# Patient Record
Sex: Female | Born: 1975 | Race: Black or African American | Hispanic: No | Marital: Single | Smoking: Never smoker
Health system: Southern US, Community
[De-identification: ages and names within clinical notes are randomized; demographics above are authoritative.]

## PROBLEM LIST (undated history)

## (undated) DIAGNOSIS — F909 Attention-deficit hyperactivity disorder, unspecified type: Secondary | ICD-10-CM

---

## 1999-11-02 ENCOUNTER — Other Ambulatory Visit: Admission: RE | Admit: 1999-11-02 | Discharge: 1999-11-02 | Payer: Self-pay | Admitting: Family Medicine

## 2001-09-06 ENCOUNTER — Other Ambulatory Visit: Admission: RE | Admit: 2001-09-06 | Discharge: 2001-09-06 | Payer: Self-pay | Admitting: Ophthalmology

## 2002-12-25 ENCOUNTER — Other Ambulatory Visit: Admission: RE | Admit: 2002-12-25 | Discharge: 2002-12-25 | Payer: Self-pay | Admitting: Family Medicine

## 2004-12-13 ENCOUNTER — Other Ambulatory Visit: Admission: RE | Admit: 2004-12-13 | Discharge: 2004-12-13 | Payer: Self-pay | Admitting: Family Medicine

## 2006-02-06 ENCOUNTER — Other Ambulatory Visit: Admission: RE | Admit: 2006-02-06 | Discharge: 2006-02-06 | Payer: Self-pay | Admitting: Family Medicine

## 2006-06-09 ENCOUNTER — Emergency Department (HOSPITAL_COMMUNITY): Admission: EM | Admit: 2006-06-09 | Discharge: 2006-06-09 | Payer: Self-pay | Admitting: Emergency Medicine

## 2006-06-11 ENCOUNTER — Encounter: Admission: RE | Admit: 2006-06-11 | Discharge: 2006-06-11 | Payer: Self-pay | Admitting: Family Medicine

## 2006-11-11 ENCOUNTER — Emergency Department (HOSPITAL_COMMUNITY): Admission: EM | Admit: 2006-11-11 | Discharge: 2006-11-12 | Payer: Self-pay | Admitting: Emergency Medicine

## 2007-09-11 ENCOUNTER — Other Ambulatory Visit: Admission: RE | Admit: 2007-09-11 | Discharge: 2007-09-11 | Payer: Self-pay | Admitting: Family Medicine

## 2007-12-29 ENCOUNTER — Emergency Department (HOSPITAL_COMMUNITY): Admission: EM | Admit: 2007-12-29 | Discharge: 2007-12-29 | Payer: Self-pay | Admitting: Emergency Medicine

## 2008-04-19 ENCOUNTER — Emergency Department (HOSPITAL_BASED_OUTPATIENT_CLINIC_OR_DEPARTMENT_OTHER): Admission: EM | Admit: 2008-04-19 | Discharge: 2008-04-19 | Payer: Self-pay | Admitting: Emergency Medicine

## 2008-05-20 ENCOUNTER — Encounter: Admission: RE | Admit: 2008-05-20 | Discharge: 2008-05-20 | Payer: Self-pay | Admitting: Family Medicine

## 2008-10-12 ENCOUNTER — Encounter: Admission: RE | Admit: 2008-10-12 | Discharge: 2008-10-12 | Payer: Self-pay | Admitting: Family Medicine

## 2008-10-15 ENCOUNTER — Encounter: Admission: RE | Admit: 2008-10-15 | Discharge: 2008-10-15 | Payer: Self-pay | Admitting: Family Medicine
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
## 2011-07-18 ENCOUNTER — Other Ambulatory Visit (HOSPITAL_COMMUNITY)
Admission: RE | Admit: 2011-07-18 | Discharge: 2011-07-18 | Disposition: A | Discharge status: Home / Self Care | Payer: Self-pay | Source: Ambulatory Visit | Attending: Family Medicine | Admitting: Family Medicine

## 2011-07-18 DIAGNOSIS — Z1159 Encounter for screening for other viral diseases: Secondary | ICD-10-CM | POA: Insufficient documentation

## 2011-07-18 DIAGNOSIS — Z01419 Encounter for gynecological examination (general) (routine) without abnormal findings: Secondary | ICD-10-CM | POA: Insufficient documentation

## 2013-05-27 ENCOUNTER — Other Ambulatory Visit (HOSPITAL_COMMUNITY)
Admission: RE | Admit: 2013-05-27 | Discharge: 2013-05-27 | Disposition: A | Discharge status: Home / Self Care | Payer: Commercial Managed Care - PPO | Source: Ambulatory Visit | Attending: Family Medicine | Admitting: Family Medicine

## 2013-05-27 DIAGNOSIS — Z113 Encounter for screening for infections with a predominantly sexual mode of transmission: Secondary | ICD-10-CM | POA: Insufficient documentation

## 2013-05-27 DIAGNOSIS — N760 Acute vaginitis: Secondary | ICD-10-CM | POA: Insufficient documentation

## 2015-03-19 ENCOUNTER — Encounter (HOSPITAL_BASED_OUTPATIENT_CLINIC_OR_DEPARTMENT_OTHER): Payer: Self-pay | Admitting: *Deleted

## 2015-03-19 ENCOUNTER — Emergency Department (HOSPITAL_BASED_OUTPATIENT_CLINIC_OR_DEPARTMENT_OTHER)
Admission: EM | Admit: 2015-03-19 | Discharge: 2015-03-19 | Disposition: A | Discharge status: Home / Self Care | Payer: No Typology Code available for payment source | Attending: Emergency Medicine | Admitting: Emergency Medicine

## 2015-03-19 ENCOUNTER — Emergency Department (HOSPITAL_BASED_OUTPATIENT_CLINIC_OR_DEPARTMENT_OTHER): Payer: No Typology Code available for payment source

## 2015-03-19 DIAGNOSIS — Y9389 Activity, other specified: Secondary | ICD-10-CM | POA: Insufficient documentation

## 2015-03-19 DIAGNOSIS — J0110 Acute frontal sinusitis, unspecified: Secondary | ICD-10-CM | POA: Diagnosis not present

## 2015-03-19 DIAGNOSIS — Y998 Other external cause status: Secondary | ICD-10-CM | POA: Insufficient documentation

## 2015-03-19 DIAGNOSIS — S0990XA Unspecified injury of head, initial encounter: Secondary | ICD-10-CM | POA: Diagnosis not present

## 2015-03-19 DIAGNOSIS — F909 Attention-deficit hyperactivity disorder, unspecified type: Secondary | ICD-10-CM | POA: Diagnosis not present

## 2015-03-19 DIAGNOSIS — Z3202 Encounter for pregnancy test, result negative: Secondary | ICD-10-CM | POA: Insufficient documentation

## 2015-03-19 DIAGNOSIS — Y92410 Unspecified street and highway as the place of occurrence of the external cause: Secondary | ICD-10-CM | POA: Diagnosis not present

## 2015-03-19 DIAGNOSIS — S29001A Unspecified injury of muscle and tendon of front wall of thorax, initial encounter: Secondary | ICD-10-CM | POA: Insufficient documentation

## 2015-03-19 HISTORY — DX: Attention-deficit hyperactivity disorder, unspecified type: F90.9

## 2015-03-19 LAB — PREGNANCY, URINE: PREG TEST UR: NEGATIVE

## 2015-03-19 MED ORDER — CYCLOBENZAPRINE HCL 10 MG PO TABS: 10.0000 mg | ORAL_TABLET | Freq: Two times a day (BID) | ORAL | Status: AC | PRN

## 2015-03-19 MED ORDER — NAPROXEN 500 MG PO TABS: 500.0000 mg | ORAL_TABLET | Freq: Two times a day (BID) | ORAL | Status: AC

## 2015-03-19 MED ORDER — AMOXICILLIN-POT CLAVULANATE 875-125 MG PO TABS: 1.0000 | ORAL_TABLET | Freq: Two times a day (BID) | ORAL | Status: AC

## 2015-03-19 MED ORDER — IBUPROFEN 800 MG PO TABS
800.0000 mg | ORAL_TABLET | Freq: Once | ORAL | Status: AC
  Administered 2015-03-19: 800 mg via ORAL
  Filled 2015-03-19: qty 1

## 2015-03-19 NOTE — ED Notes (Signed)
Patient transported to X-ray 
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
## 2016-01-11 ENCOUNTER — Encounter (HOSPITAL_BASED_OUTPATIENT_CLINIC_OR_DEPARTMENT_OTHER): Payer: Self-pay | Admitting: *Deleted

## 2016-01-11 ENCOUNTER — Emergency Department (HOSPITAL_BASED_OUTPATIENT_CLINIC_OR_DEPARTMENT_OTHER)
Admission: EM | Admit: 2016-01-11 | Discharge: 2016-01-12 | Disposition: A | Discharge status: Home / Self Care | Payer: Commercial Managed Care - PPO | Attending: Emergency Medicine | Admitting: Emergency Medicine

## 2016-01-11 DIAGNOSIS — R51 Headache: Secondary | ICD-10-CM | POA: Diagnosis present

## 2016-01-11 DIAGNOSIS — M5481 Occipital neuralgia: Secondary | ICD-10-CM | POA: Diagnosis not present

## 2016-01-11 MED ORDER — BUPIVACAINE-EPINEPHRINE (PF) 0.5% -1:200000 IJ SOLN
1.8000 mL | Freq: Once | INTRAMUSCULAR | Status: AC
  Administered 2016-01-11: 1.8 mL
  Filled 2016-01-11: qty 1.8

## 2016-01-11 NOTE — ED Notes (Signed)
Pt c/o headache that started last week in the back of her head and has gotten worse.  It has spread to her right eye and today to her right shoulder and down her right arm.  Pt tried 400mg  ibuprofen earlier without relief.  She denies n/v, states the light is hurting a little bc her eye hurts.

## 2016-01-11 NOTE — ED Notes (Signed)
Pt states the pain is gone from the back of her head and her arm.  States the pain is getting better.

## 2016-01-11 NOTE — ED Notes (Signed)
Per Pt. Headache started last Monday getting worse everyday since last week. Started in back of head and to top of head now to R eye and front of head.  Pt. Reports the pain has gone into the R shoulder and R arm now tonight.  Pain is 7/10.   No nausea vomiting or diarrhea per Pt.

## 2016-01-11 NOTE — ED Provider Notes (Signed)
CSN: [id number]     Arrival date & time 01/11/16  2302 History  By signing my name below, I, Arianna Nassar, attest that this documentation has been prepared under the direction and in the presence of Paula LibraJohn Spurgeon Gancarz, MD. Electronically Signed: Octavia HeirArianna Nassar, ED Scribe. 01/11/2016. 11:24 PM.    Chief Complaint  Patient presents with  . Headache      The history is provided by the patient. No language interpreter was used.   HPI Comments: Tina PoliKimberly Schwartz is a 40 y.o. female who presents to the Emergency Department complaining of a constant, gradually worsening, moderate, pressure-like right occipital headache onset nine days ago. She reports she has been having associated light sensitivity and throbbing right eye pain She developed right-sided neck pain today that radiates down her right arm. She has been taking Aleve and ibuprofen to alleviate her pain with partial relief. Pt says applying pressure to her occipital scalp modifies her pain (decreasing or increasing pain depending on where pressure is applied). Pt denies numbness, weakness, nausea, vomiting or diarrhea.  Past Medical History  Diagnosis Date  . ADHD (attention deficit hyperactivity disorder)    History reviewed. No pertinent past surgical history. No family history on file. Social History  Substance Use Topics  . Smoking status: Never Smoker   . Smokeless tobacco: None  . Alcohol Use: No   OB History    No data available     Review of Systems  A complete 10 system review of systems was obtained and all systems are negative except as noted in the HPI and PMH.    Allergies  Aspirin and Clindamycin/lincomycin  Home Medications   Prior to Admission medications   Medication Sig Start Date End Date Taking? Authorizing Provider  cyclobenzaprine (FLEXERIL) 10 MG tablet Take 1 tablet (10 mg total) by mouth 2 (two) times daily as needed for muscle spasms. 03/19/15   Alvira MondayErin Schlossman, MD  Fexofenadine HCl (ALLEGRA PO) Take by  mouth.    Historical Provider, MD  Methylphenidate HCl (QUILLIVANT XR PO) Take by mouth.    Historical Provider, MD  naproxen (NAPROSYN) 500 MG tablet Take 1 tablet (500 mg total) by mouth 2 (two) times daily. 03/19/15   Alvira MondayErin Schlossman, MD   Triage vitals: BP 115/78 mmHg  Pulse 79  Temp(Src) 99.5 F (37.5 C) (Oral)  Resp 18  Ht 5\' 1"  (1.549 m)  Wt 106 lb (48.081 kg)  BMI 20.04 kg/m2  SpO2 99%  LMP 12/24/2015 Physical Exam General: Well-developed, well-nourished female in no acute distress; appearance consistent with age of record HENT: normocephalic; atraumatic; tenderness of right occipital scalp near base of skull Eyes: pupils equal, round and reactive to light; extraocular muscles intact Neck: supple; no change in pain on range of motion Heart: regular rate and rhythm Lungs: clear to auscultation bilaterally Abdomen: soft; nondistended; nontender; no masses or hepatosplenomegaly; bowel sounds present Extremities: No deformity; full range of motion; pulses normal Neurologic: Awake, alert and oriented; motor function intact in all extremities and symmetric; no facial droop Skin: Warm and dry Psychiatric: Normal mood and affect  ED Course  Procedures  DIAGNOSTIC STUDIES: Oxygen Saturation is 99% on RA, normal by my interpretation.  COORDINATION OF CARE:  11:23 PM Discussed treatment plan which includes marcaine with pt at bedside and pt agreed to plan.  OCCIPITAL BLOCK 1.8 milliliters of 0.5% bupivacaine with epinephrine were injected approximately 2 centimeters above and 2 centimeters to the right of the vertebra prominens. Significant pain relief was obtained  consistent with occipital neuralgia. There were no immediate complications and the patient tolerated this well.  MDM   Final diagnoses:  Occipital neuralgia of right side   I personally performed the services described in this documentation, which was scribed in my presence. The recorded information has been reviewed  and is accurate.    Paula Libra, MD 01/12/16 0002

## 2016-01-12 MED ORDER — HYDROCODONE-ACETAMINOPHEN 5-325 MG PO TABS: 1.0000 | ORAL_TABLET | Freq: Four times a day (QID) | ORAL | Status: AC | PRN

## 2016-01-12 NOTE — ED Notes (Signed)
Pt verbalizes understanding of d/c instructions and denies any further needs at this time. 

## 2016-01-12 NOTE — Discharge Instructions (Signed)
Occipital Neuralgia Occipital neuralgia is a type of headache that causes episodes of very bad pain in the back of your head. Pain from occipital neuralgia may spread (radiate) to other parts of your head. The pain is usually brief and often goes away after you rest and relax. These headaches may be caused by irritation of the nerves that leave your spinal cord high up in your neck, just below the base of your skull (occipital nerves). Your occipital nerves transmit sensations from the back of your head, the top of your head, and the areas behind your ears. CAUSES Occipital neuralgia can occur without any known cause (primary headache syndrome). In other cases, occipital neuralgia is caused by pressure on or irritation of one of the two occipital nerves. Causes of occipital nerve compression or irritation include:  Wear and tear of the vertebrae in the neck (osteoarthritis).  Neck injury.  Disease of the disks that separate the vertebrae.  Tumors.  Gout.  Infections.  Diabetes.  Swollen blood vessels that put pressure on the occipital nerves.  Muscle spasm in the neck. SIGNS AND SYMPTOMS Pain is the main symptom of occipital neuralgia. It usually starts in the back of the head but may also be felt in other areas supplied by the occipital nerves. Pain is usually on one side but may be on both sides. You may have:   Brief episodes of very bad pain that is burning, stabbing, shocking, or shooting.  Pain behind the eye.  Pain triggered by neck movement or hair brushing.  Scalp tenderness.  Aching in the back of the head between episodes of very bad pain. DIAGNOSIS  Your health care provider may diagnose occipital neuralgia based on your symptoms and a physical exam. During the exam, the health care provider may push on areas supplied by the occipital nerves to see if they are painful. Some tests may also be done to help in making the diagnosis. These may include:  Imaging studies of  the upper spinal cord, such as an MRI or CT scan. These may show compression or spinal cord abnormalities.  Nerve block. You will get an injection of numbing medicine (local anesthetic) near the occipital nerve to see if this relieves pain. TREATMENT  Treatment may begin with simple measures, such as:   Rest.  Massage.  Heat.  Over-the-counter pain relievers. If these measures do not work, you may need other treatments, including:  Medicines such as:  Prescription-strength anti-inflammatory medicines.  Muscle relaxants.  Antiseizure medicines.  Antidepressants.  Steroid injection. This involves injections of local anesthetic and strong anti-inflammatory drugs (steroids).  Pulsed radiofrequency. Wires are implanted to deliver electrical impulses that block pain signals from the occipital nerve.  Physical therapy.  Surgery to relieve nerve pressure. HOME CARE INSTRUCTIONS  Take all medicines as directed by your health care provider.  Avoid activities that cause pain.  Rest when you have an attack of pain.  Try gentle massage or a heating pad to relieve pain.  Work with a physical therapist to learn stretching exercises you can do at home.  Try a different pillow or sleeping position.  Practice good posture.  Try to stay active. Get regular exercise that does not cause pain. Ask your health care provider to suggest safe exercises for you.  Keep all follow-up visits as directed by your health care provider. This is important. SEEK MEDICAL CARE IF:  Your medicine is not working.  You have new or worsening symptoms. SEEK IMMEDIATE MEDICAL CARE   IF:  You have very bad head pain that is not going away.  You have a sudden change in vision, balance, or speech. MAKE SURE YOU:  Understand these instructions.  Will watch your condition.  Will get help right away if you are not doing well or get worse.   This information is not intended to replace advice given to  you by your health care provider. Make sure you discuss any questions you have with your health care provider.   Document Released: 08/15/2001 Document Revised: 09/11/2014 Document Reviewed: 08/13/2013 Elsevier Interactive Patient Education 2016 [employer]  

## 2020-07-05 HISTORY — PX: PARTIAL NEPHRECTOMY: SHX414

## 2020-12-18 ENCOUNTER — Other Ambulatory Visit: Payer: Self-pay

## 2020-12-18 ENCOUNTER — Encounter (HOSPITAL_BASED_OUTPATIENT_CLINIC_OR_DEPARTMENT_OTHER): Payer: Self-pay | Admitting: Emergency Medicine

## 2020-12-18 ENCOUNTER — Emergency Department (HOSPITAL_BASED_OUTPATIENT_CLINIC_OR_DEPARTMENT_OTHER)
Admission: EM | Admit: 2020-12-18 | Discharge: 2020-12-18 | Disposition: A | Discharge status: Home / Self Care | Payer: PRIVATE HEALTH INSURANCE | Attending: Emergency Medicine | Admitting: Emergency Medicine

## 2020-12-18 DIAGNOSIS — R197 Diarrhea, unspecified: Secondary | ICD-10-CM | POA: Insufficient documentation

## 2020-12-18 DIAGNOSIS — R112 Nausea with vomiting, unspecified: Secondary | ICD-10-CM | POA: Diagnosis not present

## 2020-12-18 DIAGNOSIS — R109 Unspecified abdominal pain: Secondary | ICD-10-CM | POA: Insufficient documentation

## 2020-12-18 LAB — CBC WITH DIFFERENTIAL/PLATELET
Abs Immature Granulocytes: 0.02 10*3/uL (ref 0.00–0.07)
Basophils Absolute: 0 10*3/uL (ref 0.0–0.1)
Basophils Relative: 0 %
Eosinophils Absolute: 0 10*3/uL (ref 0.0–0.5)
Eosinophils Relative: 0 %
HCT: 33.6 % — ABNORMAL LOW (ref 36.0–46.0)
Hemoglobin: 12.6 g/dL (ref 12.0–15.0)
Immature Granulocytes: 0 %
Lymphocytes Relative: 2 %
Lymphs Abs: 0.2 10*3/uL — ABNORMAL LOW (ref 0.7–4.0)
MCH: 29.6 pg (ref 26.0–34.0)
MCHC: 37.5 g/dL — ABNORMAL HIGH (ref 30.0–36.0)
MCV: 79.1 fL — ABNORMAL LOW (ref 80.0–100.0)
Monocytes Absolute: 0.3 10*3/uL (ref 0.1–1.0)
Monocytes Relative: 4 %
Neutro Abs: 7.4 10*3/uL (ref 1.7–7.7)
Neutrophils Relative %: 94 %
Platelets: 174 10*3/uL (ref 150–400)
RBC: 4.25 MIL/uL (ref 3.87–5.11)
RDW: 16.3 % — ABNORMAL HIGH (ref 11.5–15.5)
WBC: 7.9 10*3/uL (ref 4.0–10.5)
nRBC: 0 % (ref 0.0–0.2)

## 2020-12-18 LAB — COMPREHENSIVE METABOLIC PANEL
ALT: 10 U/L (ref 0–44)
AST: 15 U/L (ref 15–41)
Albumin: 4.1 g/dL (ref 3.5–5.0)
Alkaline Phosphatase: 45 U/L (ref 38–126)
Anion gap: 8 (ref 5–15)
BUN: 16 mg/dL (ref 6–20)
CO2: 22 mmol/L (ref 22–32)
Calcium: 9.2 mg/dL (ref 8.9–10.3)
Chloride: 106 mmol/L (ref 98–111)
Creatinine, Ser: 0.75 mg/dL (ref 0.44–1.00)
GFR, Estimated: >60 mL/min (ref >60)
Glucose, Bld: 118 mg/dL — ABNORMAL HIGH (ref 70–99)
Potassium: 3.6 mmol/L (ref 3.5–5.1)
Sodium: 136 mmol/L (ref 135–145)
Total Bilirubin: 1.3 mg/dL — ABNORMAL HIGH (ref 0.3–1.2)
Total Protein: 7.9 g/dL (ref 6.5–8.1)

## 2020-12-18 MED ORDER — SODIUM CHLORIDE 0.9 % IV BOLUS
1000.0000 mL | Freq: Once | INTRAVENOUS | Status: AC
  Administered 2020-12-18: 1000 mL via INTRAVENOUS

## 2020-12-18 MED ORDER — ONDANSETRON HCL 4 MG/2ML IJ SOLN
4.0000 mg | Freq: Once | INTRAMUSCULAR | Status: AC
  Administered 2020-12-18: 4 mg via INTRAVENOUS
  Filled 2020-12-18: qty 2

## 2020-12-18 MED ORDER — ONDANSETRON HCL 4 MG PO TABS: 4.0000 mg | ORAL_TABLET | Freq: Four times a day (QID) | ORAL | 0 refills | Status: AC

## 2020-12-18 NOTE — ED Triage Notes (Signed)
Pt states NVD since 10 pm 4-15 feels like it may be due to fish she had for dinner.

## 2020-12-18 NOTE — ED Provider Notes (Signed)
[facility] EMERGENCY DEPARTMENT Provider Note   CSN: [id number] Arrival date & time: 12/18/20  0532     History Chief Complaint  Patient presents with  . Nausea  . Emesis  . Diarrhea    Tina Schwartz is a 45 y.o. female.  Patient presents to the emergency department for evaluation of nausea, vomiting and diarrhea.  Symptoms began around 10 PM.  She has had multiple episodes of each through the night.  She reports she cannot hold anything down.  Reports some mild abdominal cramping.  No fever.  No hematemesis or rectal bleeding.        Past Medical History:  Diagnosis Date  . ADHD (attention deficit hyperactivity disorder)     There are no problems to display for this patient.   Past Surgical History:  Procedure Laterality Date  . PARTIAL NEPHRECTOMY Left 07/2020     OB History   No obstetric history on file.     History reviewed. No pertinent family history.  Social History   Tobacco Use  . Smoking status: Never Smoker  Substance Use Topics  . Alcohol use: No  . Drug use: No    Home Medications Prior to Admission medications   Medication Sig Start Date End Date Taking? Authorizing Provider  fexofenadine-pseudoephedrine (ALLEGRA-D) 60-120 MG 12 hr tablet Take 1 tablet by mouth 2 (two) times daily.   Yes [provider]  ondansetron (ZOFRAN) 4 MG tablet Take 1 tablet (4 mg total) by mouth every 6 (six) hours. 12/18/20  Yes Toia Micale, Canary Brim, MD  cyclobenzaprine (FLEXERIL) 10 MG tablet Take 1 tablet (10 mg total) by mouth 2 (two) times daily as needed for muscle spasms. 03/19/15   Alvira Monday, MD  Fexofenadine HCl (ALLEGRA PO) Take by mouth.    [provider]  HYDROcodone-acetaminophen (NORCO) 5-325 MG tablet Take 1-2 tablets by mouth every 6 (six) hours as needed (for pain). 01/11/16   Molpus, John, MD  Methylphenidate HCl (QUILLIVANT XR PO) Take by mouth.    [provider]  naproxen (NAPROSYN) 500 MG  tablet Take 1 tablet (500 mg total) by mouth 2 (two) times daily. 03/19/15   Alvira Monday, MD    Allergies    Aspirin and Clindamycin/lincomycin  Review of Systems   Review of Systems  Gastrointestinal: Positive for diarrhea, nausea and vomiting.  All other systems reviewed and are negative.   Physical Exam Updated Vital Signs BP 120/76 (BP Location: Right Arm)   Pulse 84   Temp 98.4 F (36.9 C) (Oral)   Resp 18   Ht 5\' 1"  (1.549 m)   Wt 52.2 kg   LMP 11/17/2020 (Approximate)   SpO2 100%   BMI 21.73 kg/m   Physical Exam Vitals and nursing note reviewed.  Constitutional:      General: She is not in acute distress.    Appearance: Normal appearance. She is well-developed.  HENT:     Head: Normocephalic and atraumatic.     Right Ear: Hearing normal.     Left Ear: Hearing normal.     Nose: Nose normal.  Eyes:     Conjunctiva/sclera: Conjunctivae normal.     Pupils: Pupils are equal, round, and reactive to light.  Cardiovascular:     Rate and Rhythm: Regular rhythm.     Heart sounds: S1 normal and S2 normal. No murmur heard. No friction rub. No gallop.   Pulmonary:     Effort: Pulmonary effort is normal. No respiratory distress.  Breath sounds: Normal breath sounds.  Chest:     Chest wall: No tenderness.  Abdominal:     General: Bowel sounds are normal.     Palpations: Abdomen is soft.     Tenderness: There is no abdominal tenderness. There is no guarding or rebound. Negative signs include Murphy's sign and McBurney's sign.     Hernia: No hernia is present.  Musculoskeletal:        General: Normal range of motion.     Cervical back: Normal range of motion and neck supple.  Skin:    General: Skin is warm and dry.     Findings: No rash.  Neurological:     Mental Status: She is alert and oriented to person, place, and time.     GCS: GCS eye subscore is 4. GCS verbal subscore is 5. GCS motor subscore is 6.     Cranial Nerves: No cranial nerve deficit.      Sensory: No sensory deficit.     Coordination: Coordination normal.  Psychiatric:        Speech: Speech normal.        Behavior: Behavior normal.        Thought Content: Thought content normal.     ED Results / Procedures / Treatments   Labs (all labs ordered are listed, but only abnormal results are displayed) Labs Reviewed  CBC WITH DIFFERENTIAL/PLATELET - Abnormal; Notable for the following components:      Result Value   HCT 33.6 (*)    MCV 79.1 (*)    MCHC 37.5 (*)    RDW 16.3 (*)    Lymphs Abs 0.2 (*)    All other components within normal limits  COMPREHENSIVE METABOLIC PANEL    EKG None  Radiology No results found.  Procedures Procedures   Medications Ordered in ED Medications  sodium chloride 0.9 % bolus 1,000 mL (1,000 mLs Intravenous New Bag/Given 12/18/20 0612)  ondansetron (ZOFRAN) injection 4 mg (4 mg Intravenous Given 12/18/20 6767)    ED Course  I have reviewed the triage vital signs and the nursing notes.  Pertinent labs & imaging results that were available during my care of the patient were reviewed by me and considered in my medical decision making (see chart for details).    MDM Rules/Calculators/A&P                          Patient presents to the emergency department for evaluation of nausea, vomiting and diarrhea.  Patient reports that symptoms began sometime after eating dinner tonight.  She thinks it might have been the fish.  Patient with a benign abdominal exam.  Vital signs are normal, no tachycardia, no fever.  Lab work reassuring.  Patient feeling much better after hydration and Zofran.  She is tolerating oral intake.  Will discharge with symptomatic treatment.  Final Clinical Impression(s) / ED Diagnoses Final diagnoses:  Nausea vomiting and diarrhea    Rx / DC Orders ED Discharge Orders         Ordered    ondansetron (ZOFRAN) 4 MG tablet  Every 6 hours        12/18/20 0641           Gilda Crease, MD 12/18/20  [phone]

## 2020-12-18 NOTE — ED Notes (Signed)
ED Provider at bedside. 

## 2021-05-13 ENCOUNTER — Other Ambulatory Visit: Payer: Self-pay

## 2021-05-13 ENCOUNTER — Emergency Department (HOSPITAL_BASED_OUTPATIENT_CLINIC_OR_DEPARTMENT_OTHER)
Admission: EM | Admit: 2021-05-13 | Discharge: 2021-05-14 | Disposition: A | Discharge status: Home / Self Care | Payer: No Typology Code available for payment source | Attending: Emergency Medicine | Admitting: Emergency Medicine

## 2021-05-13 ENCOUNTER — Encounter (HOSPITAL_BASED_OUTPATIENT_CLINIC_OR_DEPARTMENT_OTHER): Payer: Self-pay

## 2021-05-13 ENCOUNTER — Emergency Department (HOSPITAL_BASED_OUTPATIENT_CLINIC_OR_DEPARTMENT_OTHER): Payer: No Typology Code available for payment source

## 2021-05-13 DIAGNOSIS — Z79899 Other long term (current) drug therapy: Secondary | ICD-10-CM | POA: Insufficient documentation

## 2021-05-13 DIAGNOSIS — M546 Pain in thoracic spine: Secondary | ICD-10-CM | POA: Insufficient documentation

## 2021-05-13 MED ORDER — METHOCARBAMOL 500 MG PO TABS: 500.0000 mg | ORAL_TABLET | Freq: Two times a day (BID) | ORAL | 0 refills | Status: DC
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
## 2021-11-03 ENCOUNTER — Emergency Department (HOSPITAL_BASED_OUTPATIENT_CLINIC_OR_DEPARTMENT_OTHER): Payer: PRIVATE HEALTH INSURANCE

## 2021-11-03 ENCOUNTER — Other Ambulatory Visit: Payer: Self-pay

## 2021-11-03 ENCOUNTER — Encounter (HOSPITAL_BASED_OUTPATIENT_CLINIC_OR_DEPARTMENT_OTHER): Payer: Self-pay | Admitting: *Deleted

## 2021-11-03 ENCOUNTER — Emergency Department (HOSPITAL_BASED_OUTPATIENT_CLINIC_OR_DEPARTMENT_OTHER)
Admission: EM | Admit: 2021-11-03 | Discharge: 2021-11-03 | Disposition: A | Discharge status: Home / Self Care | Payer: PRIVATE HEALTH INSURANCE | Attending: Emergency Medicine | Admitting: Emergency Medicine

## 2021-11-03 DIAGNOSIS — N83202 Unspecified ovarian cyst, left side: Secondary | ICD-10-CM | POA: Diagnosis not present

## 2021-11-03 DIAGNOSIS — R8271 Bacteriuria: Secondary | ICD-10-CM | POA: Insufficient documentation

## 2021-11-03 DIAGNOSIS — R1032 Left lower quadrant pain: Secondary | ICD-10-CM | POA: Diagnosis present

## 2021-11-03 LAB — CBC
HCT: 31.9 % — ABNORMAL LOW (ref 36.0–46.0)
Hemoglobin: 12 g/dL (ref 12.0–15.0)
MCH: 30.8 pg (ref 26.0–34.0)
MCHC: 37.6 g/dL — ABNORMAL HIGH (ref 30.0–36.0)
MCV: 82 fL (ref 80.0–100.0)
Platelets: 170 10*3/uL (ref 150–400)
RBC: 3.89 MIL/uL (ref 3.87–5.11)
RDW: 16 % — ABNORMAL HIGH (ref 11.5–15.5)
WBC: 10.3 10*3/uL (ref 4.0–10.5)

## 2021-11-03 LAB — URINALYSIS, ROUTINE W REFLEX MICROSCOPIC
Bilirubin Urine: NEGATIVE
Glucose, UA: NEGATIVE mg/dL
Ketones, ur: NEGATIVE mg/dL
Leukocytes,Ua: NEGATIVE
Nitrite: NEGATIVE
Protein, ur: NEGATIVE mg/dL
Specific Gravity, Urine: 1.015 (ref 1.005–1.030)
pH: 6.5 (ref 5.0–8.0)

## 2021-11-03 LAB — LIPASE, BLOOD: Lipase: 34 U/L (ref 11–51)

## 2021-11-03 LAB — COMPREHENSIVE METABOLIC PANEL
ALT: 10 U/L (ref 0–44)
AST: 16 U/L (ref 15–41)
Albumin: 3.9 g/dL (ref 3.5–5.0)
Alkaline Phosphatase: 47 U/L (ref 38–126)
Anion gap: 7 (ref 5–15)
BUN: 10 mg/dL (ref 6–20)
CO2: 23 mmol/L (ref 22–32)
Calcium: 9.1 mg/dL (ref 8.9–10.3)
Chloride: 106 mmol/L (ref 98–111)
Creatinine, Ser: 0.76 mg/dL (ref 0.44–1.00)
GFR, Estimated: >60 mL/min (ref >60)
Glucose, Bld: 115 mg/dL — ABNORMAL HIGH (ref 70–99)
Potassium: 3.5 mmol/L (ref 3.5–5.1)
Sodium: 136 mmol/L (ref 135–145)
Total Bilirubin: 0.8 mg/dL (ref 0.3–1.2)
Total Protein: 7.7 g/dL (ref 6.5–8.1)

## 2021-11-03 LAB — PREGNANCY, URINE: Preg Test, Ur: NEGATIVE

## 2021-11-03 LAB — URINALYSIS, MICROSCOPIC (REFLEX)

## 2021-11-03 MED ORDER — FENTANYL CITRATE PF 50 MCG/ML IJ SOSY
25.0000 ug | PREFILLED_SYRINGE | Freq: Once | INTRAMUSCULAR | Status: AC
  Administered 2021-11-03: 25 ug via INTRAVENOUS
  Filled 2021-11-03: qty 1

## 2021-11-03 MED ORDER — OXYCODONE-ACETAMINOPHEN 5-325 MG PO TABS
1.0000 | ORAL_TABLET | Freq: Once | ORAL | Status: AC
  Administered 2021-11-03: 1 via ORAL
  Filled 2021-11-03: qty 1

## 2021-11-03 MED ORDER — SODIUM CHLORIDE 0.9 % IV BOLUS
1000.0000 mL | Freq: Once | INTRAVENOUS | Status: AC
  Administered 2021-11-03: 1000 mL via INTRAVENOUS

## 2021-11-03 MED ORDER — METHOCARBAMOL 500 MG PO TABS: 500.0000 mg | ORAL_TABLET | Freq: Two times a day (BID) | ORAL | 0 refills | Status: DC

## 2021-11-03 NOTE — Discharge Instructions (Signed)
Your ultrasound shows that you have a 2.3 cm ovarian cyst on your left side that is likely hemorrhagic. ?You will need to follow-up with your OB/GYN for repeat ultrasound in 6 to 12 weeks to ensure resolution. ?Return to the ER if you start to experience worsening symptoms, trouble breathing, worsening pelvic pain or abnormal bleeding. ?

## 2021-11-03 NOTE — ED Provider Notes (Signed)
[facility] EMERGENCY DEPARTMENT Provider Note   CSN: [id number] Arrival date & time: 11/03/21  1828     History  Chief Complaint  Patient presents with   Abdominal Pain    Tina Schwartz is a 46 y.o. female who is status post left partial nephrectomy done in 2021 presenting to the ED with a chief complaint of left lower quadrant abdominal pain.  Symptoms began yesterday morning and woke her up from her sleep.  Reports sharp, intermittent pain in her left lower quadrant that will intermittently radiate to her back.  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  She had a CT scan of her abdomen pelvis done without contrast which showed constipation as well as a tubular structure in her left adnexal area.  She did take laxatives and had bowel movements but continues to have pain.  Denies any vaginal discharge, abnormal vaginal bleeding, vomiting, chest pain, shortness of breath or fever.   Abdominal Pain Associated symptoms: no chest pain, no chills, no constipation, no cough, no diarrhea, no dysuria, no fever, no hematuria, no nausea, no shortness of breath, no sore throat and no vomiting       Home Medications Prior to Admission medications   Medication Sig Start Date End Date Taking? Authorizing Provider  methocarbamol (ROBAXIN) 500 MG tablet Take 1 tablet (500 mg total) by mouth 2 (two) times daily. 11/03/21  Yes Calan Doren, PA-C  cyclobenzaprine (FLEXERIL) 10 MG tablet Take 1 tablet (10 mg total) by mouth 2 (two) times daily as needed for muscle spasms. 03/19/15   Alvira Monday, MD  Fexofenadine HCl (ALLEGRA PO) Take by mouth.    [provider]  fexofenadine-pseudoephedrine (ALLEGRA-D) 60-120 MG 12 hr tablet Take 1 tablet by mouth 2 (two) times daily.    [provider]  HYDROcodone-acetaminophen (NORCO) 5-325 MG tablet Take 1-2 tablets by mouth every 6 (six) hours as needed (for pain). 01/11/16   Molpus, John, MD   Methylphenidate HCl (QUILLIVANT XR PO) Take by mouth.    [provider]  naproxen (NAPROSYN) 500 MG tablet Take 1 tablet (500 mg total) by mouth 2 (two) times daily. 03/19/15   Alvira Monday, MD  ondansetron (ZOFRAN) 4 MG tablet Take 1 tablet (4 mg total) by mouth every 6 (six) hours. 12/18/20   Gilda Crease, MD      Allergies    Aspirin and Clindamycin/lincomycin    Review of Systems   Review of Systems  Constitutional:  Negative for appetite change, chills and fever.  HENT:  Negative for ear pain, rhinorrhea, sneezing and sore throat.   Eyes:  Negative for photophobia and visual disturbance.  Respiratory:  Negative for cough, chest tightness, shortness of breath and wheezing.   Cardiovascular:  Negative for chest pain and palpitations.  Gastrointestinal:  Positive for abdominal pain. Negative for blood in stool, constipation, diarrhea, nausea and vomiting.  Genitourinary:  Negative for dysuria, hematuria and urgency.  Musculoskeletal:  Negative for myalgias.  Skin:  Negative for rash.  Neurological:  Negative for dizziness, weakness and light-headedness.   Physical Exam Updated Vital Signs BP 120/80 (BP Location: Right Arm)    Pulse 84    Temp 98.7 F (37.1 C) (Oral)    Resp 18    Ht 5\' 1"  (1.549 m)    Wt 51.3 kg    LMP 10/16/2021    SpO2 100%    BMI 21.37 kg/m  Physical Exam Vitals and  nursing note reviewed.  Constitutional:      General: She is not in acute distress.    Appearance: She is well-developed.  HENT:     Head: Normocephalic and atraumatic.     Nose: Nose normal.  Eyes:     General: No scleral icterus.       Left eye: No discharge.     Conjunctiva/sclera: Conjunctivae normal.  Cardiovascular:     Rate and Rhythm: Normal rate and regular rhythm.     Heart sounds: Normal heart sounds. No murmur heard.   No friction rub. No gallop.  Pulmonary:     Effort: Pulmonary effort is normal. No respiratory distress.     Breath sounds: Normal breath  sounds.  Abdominal:     General: Bowel sounds are normal. There is no distension.     Palpations: Abdomen is soft.     Tenderness: There is abdominal tenderness in the left lower quadrant. There is no guarding.  Musculoskeletal:        General: Normal range of motion.     Cervical back: Normal range of motion and neck supple.  Skin:    General: Skin is warm and dry.     Findings: No rash.  Neurological:     Mental Status: She is alert.     Motor: No abnormal muscle tone.     Coordination: Coordination normal.    ED Results / Procedures / Treatments   Labs (all labs ordered are listed, but only abnormal results are displayed) Labs Reviewed  COMPREHENSIVE METABOLIC PANEL - Abnormal; Notable for the following components:      Result Value   Glucose, Bld 115 (*)    All other components within normal limits  CBC - Abnormal; Notable for the following components:   HCT 31.9 (*)    MCHC 37.6 (*)    RDW 16.0 (*)    All other components within normal limits  URINALYSIS, ROUTINE W REFLEX MICROSCOPIC - Abnormal; Notable for the following components:   Hgb urine dipstick TRACE (*)    All other components within normal limits  URINALYSIS, MICROSCOPIC (REFLEX) - Abnormal; Notable for the following components:   Bacteria, UA RARE (*)    All other components within normal limits  LIPASE, BLOOD  PREGNANCY, URINE    EKG None  Radiology US PELVIC COMPLETE W TRANSVAGINAL AND TORSION R/O  Result Date: 11/03/2021 CLINICAL DATA:  Left-sided pain, possible dilated left fallopian tube on previous CT. EXAM: TRANSABDOMINAL AND TRANSVAGINAL ULTRASOUND OF PELVIS DOPPLER ULTRASOUND OF OVARIES TECHNIQUE: Both transabdominal and transvaginal ultrasound examinations of the pelvis were performed. Transabdominal technique was performed for global imaging of the pelvis including uterus, ovaries, adnexal regions, and pelvic cul-de-sac. It was necessary to proceed with endovaginal exam following the  transabdominal exam to visualize the adnexal structures. Color and duplex Doppler ultrasound was utilized to evaluate blood flow to the ovaries. COMPARISON:  11/02/2021 FINDINGS: Uterus Measurements: 6.7 x 4.2 x 5.8 cm = volume: 85 mL. Multiple intramural and subserosal uterine fibroids are identified, largest in the posterior aspect of the uterine fundus measuring 4.2 x 4.4 by 4.0 cm. Endometrium Thickness: 9 mm.  No focal abnormality visualized. Right ovary Measurements: 2.4 x 1.6 by 2.4 cm = volume: 4.9 mL. Normal appearance/no adnexal mass. Left ovary Measurements: 4.1 x 2.3 by 3.9 cm = volume: 18.7 mL. Normal follicles are identified. 2.3 x 1.8 by 2.0 cm complex cystic structure consistent with hemorrhagic cyst or ruptured follicle. Pulsed Doppler evaluation of  both ovaries demonstrates normal low-resistance arterial and venous waveforms. Other findings There is no evidence of left-sided hydrosalpinx. This structure visualized on the prior CT may have reflected fluid-filled bowel. Trace pelvic free fluid is likely physiologic. IMPRESSION: 1. Fibroid uterus. 2. 2.3 cm complex left ovarian cyst, likely a hemorrhagic cyst. Short-interval follow up ultrasound in 6-12 weeks is recommended, preferably during the week following the patient's normal menses. 3. No evidence of left-sided hydrosalpinx. The CT finding likely reflected unopacified bowel. 4. Trace pelvic free fluid, likely physiologic. Electronically Signed   By: Sharlet SalinaMichael  Brown M.D.   On: 11/03/2021 20:42    Procedures Procedures    Medications Ordered in ED Medications  fentaNYL (SUBLIMAZE) injection 25 mcg (25 mcg Intravenous Given 11/03/21 1933)  sodium chloride 0.9 % bolus 1,000 mL (1,000 mLs Intravenous New Bag/Given 11/03/21 2041)    ED Course/ Medical Decision Making/ A&P Clinical Course as of 11/03/21 2058  Thu Nov 03, 2021  1920 Bacteria, UA(!): RARE [HK]  1921 Preg Test, Ur: NEGATIVE [HK]    Clinical Course User Index [HK] Dietrich PatesKhatri,  Glenford Garis, PA-C                           Medical Decision Making Amount and/or Complexity of Data Reviewed Labs: ordered. Decision-making details documented in ED Course. Radiology: ordered. ECG/medicine tests: ordered.  Risk Prescription drug management.   46 year old female who is status post left partial nephrectomy presenting to the ED for continued left lower quadrant pain.  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  She also had a tubular structure seen on her left adnexal area which was unable to be characterized.  Despite taking laxatives and having bowel movements she continues to be symptomatic.  Pain began yesterday morning.  On exam she has left lower quadrant tenderness.  No rebound or guarding.  Vital signs within normal limits.  Will obtain lab work, pelvic ultrasound reassess.  Lab work interpreted by me.  CBC, CMP, lipase unremarkable.  Pregnancy test is negative for urinalysis with rare bacteria but otherwise unremarkable.  Pelvic ultrasound shows a 2.3 cm left ovarian cyst likely hemorrhagic cyst which has been the cause of her pain.  Advised on timeline to follow-up for repeat ultrasound.  No evidence of hydrosalpinx on ultrasound.  We will have her continue treatment for constipation, muscle relaxer to help with this pain.  Advised Tylenol and ibuprofen as well.  Symptoms have improved here.  She remains hemodynamically stable.  She is following up with her OB/GYN next week.  Patient knows to return for any worsening symptoms.   Patient is hemodynamically stable, in NAD, and able to ambulate in the ED. Evaluation does not show pathology that would require ongoing emergent intervention or inpatient treatment. I explained the diagnosis to the patient. Pain has been managed and has no complaints prior to discharge. Patient is comfortable with above plan and is stable for discharge at this time. All  questions were answered prior to disposition. Strict return precautions for returning to the ED were discussed. Encouraged follow up with PCP.   An After Visit Summary was printed and given to the patient.   Portions of this note were generated with Scientist, clinical (histocompatibility and immunogenetics)Dragon dictation software. Dictation errors may occur despite best attempts at proofreading.        Final Clinical Impression(s) / ED Diagnoses Final diagnoses:  LLQ pain  Cyst of left ovary    Rx / DC Orders ED Discharge Orders          Ordered    methocarbamol (ROBAXIN) 500 MG tablet  2 times daily        11/03/21 2058              Dietrich Pates, PA-C 11/03/21 2059    Maia Plan, MD 11/11/21 [phone]

## 2021-11-03 NOTE — ED Notes (Signed)
US at bedside

## 2021-11-03 NOTE — ED Notes (Signed)
Pt c/o worsening pain, MD provider made aware. ?Will dc after fluids complete ?

## 2021-11-03 NOTE — ED Triage Notes (Signed)
LMQ abdominal pain. Xrays yesterday showed constipation. She used laxatives with a large BM. Pain continues. ?

## 2022-04-19 ENCOUNTER — Emergency Department (HOSPITAL_BASED_OUTPATIENT_CLINIC_OR_DEPARTMENT_OTHER)
Admission: EM | Admit: 2022-04-19 | Discharge: 2022-04-20 | Disposition: A | Discharge status: Home / Self Care | Payer: PRIVATE HEALTH INSURANCE | Attending: Emergency Medicine | Admitting: Emergency Medicine

## 2022-04-19 ENCOUNTER — Encounter (HOSPITAL_BASED_OUTPATIENT_CLINIC_OR_DEPARTMENT_OTHER): Payer: Self-pay | Admitting: Emergency Medicine

## 2022-04-19 ENCOUNTER — Other Ambulatory Visit: Payer: Self-pay

## 2022-04-19 DIAGNOSIS — S60942A Unspecified superficial injury of right middle finger, initial encounter: Secondary | ICD-10-CM | POA: Diagnosis present

## 2022-04-19 DIAGNOSIS — Z23 Encounter for immunization: Secondary | ICD-10-CM | POA: Diagnosis not present

## 2022-04-19 DIAGNOSIS — W25XXXA Contact with sharp glass, initial encounter: Secondary | ICD-10-CM | POA: Insufficient documentation

## 2022-04-19 DIAGNOSIS — S61212A Laceration without foreign body of right middle finger without damage to nail, initial encounter: Secondary | ICD-10-CM | POA: Diagnosis not present

## 2022-04-19 MED ORDER — TETANUS-DIPHTH-ACELL PERTUSSIS 5-2.5-18.5 LF-MCG/0.5 IM SUSY
0.5000 mL | PREFILLED_SYRINGE | Freq: Once | INTRAMUSCULAR | Status: AC
  Administered 2022-04-20: 0.5 mL via INTRAMUSCULAR
  Filled 2022-04-19: qty 0.5

## 2022-04-19 NOTE — Discharge Instructions (Addendum)
It was a pleasure taking care of you today. Steri strips will fall off over the next few days. Follow-up with PCP within the next week. Return to the ER for new or worsening symptoms.

## 2022-04-19 NOTE — ED Triage Notes (Signed)
Pt states she was washing a glass and cut her right middle finger

## 2022-04-19 NOTE — ED Provider Notes (Signed)
[facility] EMERGENCY DEPARTMENT Provider Note   CSN: [id number] Arrival date & time: 04/19/22  2248     History  Chief Complaint  Patient presents with   Finger Injury    Tina Schwartz is a 46 y.o. female with a past medical history significant for ADHD who presents to the ED due to right middle finger laceration.  Patient states she cut her finger on glass just prior to arrival.  Last tetanus shot was greater than 10 years ago.  No other injuries.  Denies numbness/tingling.  Hemostasis achieved prior to arrival. Patient is right hand dominant.       Home Medications Prior to Admission medications   Medication Sig Start Date End Date Taking? Authorizing Provider  cyclobenzaprine (FLEXERIL) 10 MG tablet Take 1 tablet (10 mg total) by mouth 2 (two) times daily as needed for muscle spasms. 03/19/15   Alvira Monday, MD  Fexofenadine HCl (ALLEGRA PO) Take by mouth.    [provider]  fexofenadine-pseudoephedrine (ALLEGRA-D) 60-120 MG 12 hr tablet Take 1 tablet by mouth 2 (two) times daily.    [provider]  HYDROcodone-acetaminophen (NORCO) 5-325 MG tablet Take 1-2 tablets by mouth every 6 (six) hours as needed (for pain). 01/11/16   Molpus, John, MD  methocarbamol (ROBAXIN) 500 MG tablet Take 1 tablet (500 mg total) by mouth 2 (two) times daily. 11/03/21   Khatri, Hillary Bow, PA-C  Methylphenidate HCl (QUILLIVANT XR PO) Take by mouth.    [provider]  naproxen (NAPROSYN) 500 MG tablet Take 1 tablet (500 mg total) by mouth 2 (two) times daily. 03/19/15   Alvira Monday, MD  ondansetron (ZOFRAN) 4 MG tablet Take 1 tablet (4 mg total) by mouth every 6 (six) hours. 12/18/20   Gilda Crease, MD      Allergies    Aspirin and Clindamycin/lincomycin    Review of Systems   Review of Systems  Skin:  Positive for wound.    Physical Exam Updated Vital Signs BP 133/79 (BP Location: Left Arm)   Pulse 65   Temp 98.6 F (37 C) (Oral)   Resp  16   Ht 5\' 1"  (1.549 m)   Wt 49.9 kg   SpO2 96%   BMI 20.78 kg/m  Physical Exam Vitals and nursing note reviewed.  Constitutional:      General: She is not in acute distress.    Appearance: She is not ill-appearing.  HENT:     Head: Normocephalic.  Eyes:     Pupils: Pupils are equal, round, and reactive to light.  Cardiovascular:     Rate and Rhythm: Normal rate and regular rhythm.     Pulses: Normal pulses.     Heart sounds: Normal heart sounds. No murmur heard.    No friction rub. No gallop.  Pulmonary:     Effort: Pulmonary effort is normal.     Breath sounds: Normal breath sounds.  Abdominal:     General: Abdomen is flat. There is no distension.     Palpations: Abdomen is soft.     Tenderness: There is no abdominal tenderness. There is no guarding or rebound.  Musculoskeletal:        General: Normal range of motion.     Cervical back: Neck supple.  Skin:    General: Skin is warm and dry.     Comments: V-shaped superficial laceration to right 3rd finger. No nailbed involvement. Hemostasis achieved.   Neurological:     General: No focal deficit  present.     Mental Status: She is alert.  Psychiatric:        Mood and Affect: Mood normal.        Behavior: Behavior normal.     ED Results / Procedures / Treatments   Labs (all labs ordered are listed, but only abnormal results are displayed) Labs Reviewed - No data to display  EKG None  Radiology No results found.  Procedures .Marland KitchenLaceration Repair  Date/Time: 04/19/2022 11:54 PM  Performed by: Mannie Stabile, PA-C Authorized by: Mannie Stabile, PA-C   Consent:    Consent obtained:  Verbal   Consent given by:  Patient   Risks discussed:  Infection, need for additional repair, pain, poor cosmetic result and poor wound healing   Alternatives discussed:  No treatment and delayed treatment Universal protocol:    Procedure explained and questions answered to patient or proxy's satisfaction: yes      Relevant documents present and verified: yes     Test results available: yes     Imaging studies available: yes     Required blood products, implants, devices, and special equipment available: yes     Site/side marked: yes     Immediately prior to procedure, a time out was called: yes     Patient identity confirmed:  Verbally with patient Anesthesia:    Anesthesia method:  None Laceration details:    Location:  Finger   Finger location:  R index finger   Length (cm):  2   Depth (mm):  1 Pre-procedure details:    Preparation:  Patient was prepped and draped in usual sterile fashion Exploration:    Limited defect created (wound extended): no     Hemostasis achieved with:  Direct pressure   Imaging outcome: foreign body not noted     Wound exploration: wound explored through full range of motion     Wound extent: no nerve damage noted and no tendon damage noted     Contaminated: no   Treatment:    Area cleansed with:  Povidone-iodine and saline   Amount of cleaning:  Standard   Irrigation solution:  Sterile saline   Irrigation method:  Pressure wash   Debridement:  None   Undermining:  None   Scar revision: no   Skin repair:    Repair method:  Steri-Strips and tissue adhesive   Number of Steri-Strips:  2 Approximation:    Approximation:  Close Repair type:    Repair type:  Simple Post-procedure details:    Dressing:  Non-adherent dressing and splint for protection   Procedure completion:  Tolerated well, no immediate complications     Medications Ordered in ED Medications  Tdap (BOOSTRIX) injection 0.5 mL (has no administration in time range)    ED Course/ Medical Decision Making/ A&P                           Medical Decision Making Risk Prescription drug management.   46 year old female presents to the ED due to right third finger laceration.  Patient cut finger on glass prior to arrival.  Last tetanus shot was greater than 10 years ago.  Tetanus updated here  in the ED.  Superficial V-shaped laceration to right third finger.  No nailbed involvement. No tendon involvement. Steri-Strips placed as noted above.  Finger placed in splint for protection. Patient follow-up with PCP within 1 week. Strict ED precautions discussed with patient. Patient states understanding and  agrees to plan. Patient discharged home in no acute distress and stable vitals.  Discussed with Dr. Judd Lien who evaluated patient at bedside and agrees with assessment and plan.         Final Clinical Impression(s) / ED Diagnoses Final diagnoses:  Laceration of right middle finger without foreign body without damage to nail, initial encounter    Rx / DC Orders ED Discharge Orders     None         Jesusita Oka 04/19/22 2357    Geoffery Lyons, MD 04/20/22 [phone]

## 2022-06-09 ENCOUNTER — Emergency Department (HOSPITAL_BASED_OUTPATIENT_CLINIC_OR_DEPARTMENT_OTHER)
Admission: EM | Admit: 2022-06-09 | Discharge: 2022-06-09 | Disposition: A | Discharge status: Home / Self Care | Payer: PRIVATE HEALTH INSURANCE | Attending: Emergency Medicine | Admitting: Emergency Medicine

## 2022-06-09 ENCOUNTER — Emergency Department (HOSPITAL_BASED_OUTPATIENT_CLINIC_OR_DEPARTMENT_OTHER): Payer: PRIVATE HEALTH INSURANCE

## 2022-06-09 ENCOUNTER — Encounter (HOSPITAL_BASED_OUTPATIENT_CLINIC_OR_DEPARTMENT_OTHER): Payer: Self-pay | Admitting: Urology

## 2022-06-09 ENCOUNTER — Other Ambulatory Visit: Payer: Self-pay

## 2022-06-09 DIAGNOSIS — G44319 Acute post-traumatic headache, not intractable: Secondary | ICD-10-CM | POA: Insufficient documentation

## 2022-06-09 DIAGNOSIS — M542 Cervicalgia: Secondary | ICD-10-CM | POA: Insufficient documentation

## 2022-06-09 DIAGNOSIS — M25511 Pain in right shoulder: Secondary | ICD-10-CM | POA: Diagnosis present

## 2022-06-09 MED ORDER — METHOCARBAMOL 500 MG PO TABS: 500.0000 mg | ORAL_TABLET | Freq: Three times a day (TID) | ORAL | 0 refills | Status: AC | PRN
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
## 2022-06-09 NOTE — Discharge Instructions (Signed)

## 2022-08-24 ENCOUNTER — Other Ambulatory Visit: Payer: Self-pay

## 2022-08-24 ENCOUNTER — Emergency Department (HOSPITAL_BASED_OUTPATIENT_CLINIC_OR_DEPARTMENT_OTHER): Payer: PRIVATE HEALTH INSURANCE

## 2022-08-24 ENCOUNTER — Emergency Department (HOSPITAL_BASED_OUTPATIENT_CLINIC_OR_DEPARTMENT_OTHER)
Admission: EM | Admit: 2022-08-24 | Discharge: 2022-08-25 | Disposition: A | Discharge status: Home / Self Care | Payer: PRIVATE HEALTH INSURANCE | Attending: Emergency Medicine | Admitting: Emergency Medicine

## 2022-08-24 ENCOUNTER — Encounter (HOSPITAL_BASED_OUTPATIENT_CLINIC_OR_DEPARTMENT_OTHER): Payer: Self-pay | Admitting: Emergency Medicine

## 2022-08-24 DIAGNOSIS — R519 Headache, unspecified: Secondary | ICD-10-CM | POA: Insufficient documentation

## 2022-08-24 LAB — BASIC METABOLIC PANEL
Anion gap: 3 — ABNORMAL LOW (ref 5–15)
BUN: 11 mg/dL (ref 6–20)
CO2: 25 mmol/L (ref 22–32)
Calcium: 9.7 mg/dL (ref 8.9–10.3)
Chloride: 108 mmol/L (ref 98–111)
Creatinine, Ser: 0.71 mg/dL (ref 0.44–1.00)
GFR, Estimated: >60 mL/min (ref >60)
Glucose, Bld: 103 mg/dL — ABNORMAL HIGH (ref 70–99)
Potassium: 3.6 mmol/L (ref 3.5–5.1)
Sodium: 136 mmol/L (ref 135–145)

## 2022-08-24 LAB — CBC
HCT: 30.1 % — ABNORMAL LOW (ref 36.0–46.0)
Hemoglobin: 11.1 g/dL — ABNORMAL LOW (ref 12.0–15.0)
MCH: 29.3 pg (ref 26.0–34.0)
MCHC: 36.9 g/dL — ABNORMAL HIGH (ref 30.0–36.0)
MCV: 79.4 fL — ABNORMAL LOW (ref 80.0–100.0)
Platelets: 166 10*3/uL (ref 150–400)
RBC: 3.79 MIL/uL — ABNORMAL LOW (ref 3.87–5.11)
RDW: 16.3 % — ABNORMAL HIGH (ref 11.5–15.5)
WBC: 6.4 10*3/uL (ref 4.0–10.5)
nRBC: 0 % (ref 0.0–0.2)

## 2022-08-24 NOTE — ED Triage Notes (Addendum)
Pt reports "shooting pains" in her head X1 month, today she also has a headache.  She has a hx of migraines but "this is different."  No sensitivity to lights, noise, no nausea or vomiting.  Pain mostly happens when she turns her head. Negative NIH

## 2022-08-24 NOTE — ED Provider Notes (Signed)
[facility] EMERGENCY DEPARTMENT Provider Note   CSN: [id number] Arrival date & time: 08/24/22  1901     History  Chief Complaint  Patient presents with   Headache    Tina Schwartz is a 46 y.o. female.  Patient with no pertinent past medical history presents today with complaints of headache.  She states that same has been ongoing for the last month. States that the pain feels like a shooting pain into the right side of her head when she turns her head to the right. She states that her symptoms have become more frequent which is concerning to her. Also states that today she has had a headache on the right side which is new. States she has had migraines intermittently in the past but this feels different.  She has never been on migraine medication.  She denies any fevers, chills, neck pain, vision changes, dizziness, lightheadedness, weakness, numbness/tingling, nausea, or vomiting.  The history is provided by the patient. No language interpreter was used.  Headache      Home Medications Prior to Admission medications   Medication Sig Start Date End Date Taking? Authorizing Provider  cyclobenzaprine (FLEXERIL) 10 MG tablet Take 1 tablet (10 mg total) by mouth 2 (two) times daily as needed for muscle spasms. 03/19/15   Gareth Morgan, MD  Fexofenadine HCl (ALLEGRA PO) Take by mouth.    [provider]  fexofenadine-pseudoephedrine (ALLEGRA-D) 60-120 MG 12 hr tablet Take 1 tablet by mouth 2 (two) times daily.    [provider]  HYDROcodone-acetaminophen (NORCO) 5-325 MG tablet Take 1-2 tablets by mouth every 6 (six) hours as needed (for pain). 01/11/16   Molpus, John, MD  methocarbamol (ROBAXIN) 500 MG tablet Take 1 tablet (500 mg total) by mouth every 8 (eight) hours as needed. 06/09/22   Long, Wonda Olds, MD  Methylphenidate HCl (QUILLIVANT XR PO) Take by mouth.    [provider]  naproxen (NAPROSYN) 500 MG tablet Take 1 tablet (500 mg total)  by mouth 2 (two) times daily. 03/19/15   Gareth Morgan, MD  ondansetron (ZOFRAN) 4 MG tablet Take 1 tablet (4 mg total) by mouth every 6 (six) hours. 12/18/20   Orpah Greek, MD      Allergies    Aspirin and Clindamycin/lincomycin    Review of Systems   Review of Systems  Neurological:  Positive for headaches.  All other systems reviewed and are negative.   Physical Exam Updated Vital Signs BP 132/86   Pulse 63   Temp 98.3 F (36.8 C) (Oral)   Resp 17   Ht 5\' 1"  (1.549 m)   Wt 51.3 kg   SpO2 100%   BMI 21.35 kg/m  Physical Exam Vitals and nursing note reviewed.  Constitutional:      General: She is not in acute distress.    Appearance: Normal appearance. She is well-developed and normal weight. She is not ill-appearing, toxic-appearing or diaphoretic.  HENT:     Head: Normocephalic and atraumatic.     Comments: No tenderness noted to the temporal area of the face Eyes:     Extraocular Movements: Extraocular movements intact.     Right eye: Normal extraocular motion and no nystagmus.     Left eye: Normal extraocular motion and no nystagmus.     Pupils: Pupils are equal, round, and reactive to light.     Right eye: Pupil is round and reactive.     Left eye: Pupil is round and reactive.  Neck:     Meningeal: Brudzinski's sign and Kernig's sign absent.     Comments: No meningismus, no cervical spine tenderness to palpation. Cardiovascular:     Rate and Rhythm: Normal rate and regular rhythm.     Heart sounds: Normal heart sounds.  Pulmonary:     Effort: Pulmonary effort is normal. No respiratory distress.     Breath sounds: Normal breath sounds.  Abdominal:     General: Bowel sounds are normal.     Palpations: Abdomen is soft.  Musculoskeletal:        General: Normal range of motion.     Cervical back: Normal range of motion and neck supple.  Skin:    General: Skin is warm and dry.  Neurological:     General: No focal deficit present.     Mental  Status: She is alert and oriented to person, place, and time.     GCS: GCS eye subscore is 4. GCS verbal subscore is 5. GCS motor subscore is 6.     Sensory: Sensation is intact.     Motor: Motor function is intact.     Coordination: Coordination is intact.     Gait: Gait is intact.     Comments: Alert and oriented to self, place, time and event.    Speech is fluent, clear without dysarthria or dysphasia.    Strength 5/5 in upper/lower extremities   Sensation intact in upper/lower extremities    CN I not tested  CN II grossly intact visual fields bilaterally. Did not visualize posterior eye.  CN III, IV, VI PERRLA and EOMs intact bilaterally  CN V Intact sensation to sharp and light touch to the face  CN VII facial movements symmetric  CN VIII not tested  CN IX, X no uvula deviation, symmetric rise of soft palate  CN XI 5/5 SCM and trapezius strength bilaterally  CN XII Midline tongue protrusion, symmetric L/R movements   Psychiatric:        Mood and Affect: Mood normal.        Behavior: Behavior normal.     ED Results / Procedures / Treatments   Labs (all labs ordered are listed, but only abnormal results are displayed) Labs Reviewed  CBC - Abnormal; Notable for the following components:      Result Value   RBC 3.79 (*)    Hemoglobin 11.1 (*)    HCT 30.1 (*)    MCV 79.4 (*)    MCHC 36.9 (*)    RDW 16.3 (*)    All other components within normal limits  BASIC METABOLIC PANEL - Abnormal; Notable for the following components:   Glucose, Bld 103 (*)    Anion gap 3 (*)    All other components within normal limits    EKG None  Radiology No results found.  Procedures Procedures    Medications Ordered in ED Medications - No data to display  ED Course/ Medical Decision Making/ A&P                           Medical Decision Making Amount and/or Complexity of Data Reviewed Labs: ordered. Radiology: ordered.   This patient is a 46 y.o. female who presents to  the ED for concern of headache, this involves an extensive number of treatment options, and is a complaint that carries with it a high risk of complications and morbidity. The emergent differential diagnosis prior to evaluation includes, but is  not limited to,  Intracranial hemorrhage, meningitis, CVA, intracranial tumor, venous sinus thrombosis, migraine, cluster headache, hypertension, drug related, pseudotumor cerebri, AVM, head injury, tension headache, sinusitis, dental abscess, otitis media, TMJ, depression, temporal arteritis, glaucoma, trigeminal neuralgia.  This is not an exhaustive differential.   Past Medical History / Co-morbidities / Social History: none  Physical Exam: Physical exam performed. The pertinent findings include: Patient alert and oriented and neurologically intact without focal deficits.  Lab Tests: I ordered, and personally interpreted labs.  The pertinent results include: No acute laboratory findings   Imaging Studies: I ordered imaging studies including CTA head and neck with and without contrast. Imaging results pending at shift change   Medications: Offered patient pain medication for her headache which she refused, states she is just here for imaging.   Disposition:  Patient presents today with complaints of headache x 1 month.  She is afebrile, nontoxic-appearing, and in no acute distress with reassuring vital signs.  She is also alert and oriented and neurologically intact without focal deficits. Laboratory evaluation reassuring, CTA head and neck obtained for further evaluation. This is pending at shift change. If normal, suspect patient will be cleared for discharge.   Care handoff to Dr. Bernette Mayers at shift change. Please see their note for further evaluation and dispo.  Findings and plan of care discussed with supervising physician Dr. Bernette Mayers who is in agreement.   Final Clinical Impression(s) / ED Diagnoses Final diagnoses:  Nonintractable episodic  headache, unspecified headache type    Rx / DC Orders ED Discharge Orders     None         Vear Clock 08/25/22 0004    Pollyann Savoy, MD 09/11/22 1410

## 2022-08-25 MED ORDER — IOHEXOL 350 MG/ML SOLN
75.0000 mL | Freq: Once | INTRAVENOUS | Status: AC | PRN
  Administered 2022-08-25: 75 mL via INTRAVENOUS

## 2022-08-25 NOTE — ED Provider Notes (Signed)
Care of the patient assumed at the change of shift pending CTA of head/neck for sharp, shooting R sided head pain particularly with turning her head to the right. I personally viewed the images from radiology studies and agree with radiologist interpretation: CTA is neg for acute process. Radiologist reports subtle irregularity which could be FMD. Unclear if this would be the cause of the patient symptoms, but doubt that this is related. Her symptoms seem more neuropathic in nature, but regardless, will give referral to Vascular for further evaluation of CTA findings.    Pollyann Savoy, MD 08/25/22 [phone]
